# Patient Record
Sex: Male | Born: 2000 | Race: White | Hispanic: No | Marital: Single | Smoking: Never smoker
Health system: Southern US, Community
[De-identification: ages and names within clinical notes are randomized; demographics above are authoritative.]

---
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
## 2019-02-28 ENCOUNTER — Emergency Department: Payer: Self-pay

## 2019-02-28 ENCOUNTER — Emergency Department
Admission: EM | Admit: 2019-02-28 | Discharge: 2019-02-28 | Disposition: A | Discharge status: Home / Self Care | Payer: Self-pay | Attending: Emergency Medicine | Admitting: Emergency Medicine

## 2019-02-28 ENCOUNTER — Other Ambulatory Visit: Payer: Self-pay

## 2019-02-28 ENCOUNTER — Encounter: Payer: Self-pay | Admitting: Emergency Medicine

## 2019-02-28 DIAGNOSIS — Y9367 Activity, basketball: Secondary | ICD-10-CM | POA: Insufficient documentation

## 2019-02-28 DIAGNOSIS — Y999 Unspecified external cause status: Secondary | ICD-10-CM | POA: Insufficient documentation

## 2019-02-28 DIAGNOSIS — Y92310 Basketball court as the place of occurrence of the external cause: Secondary | ICD-10-CM | POA: Insufficient documentation

## 2019-02-28 DIAGNOSIS — S93431A Sprain of tibiofibular ligament of right ankle, initial encounter: Secondary | ICD-10-CM | POA: Insufficient documentation

## 2019-02-28 DIAGNOSIS — X503XXA Overexertion from repetitive movements, initial encounter: Secondary | ICD-10-CM | POA: Insufficient documentation

## 2019-02-28 DIAGNOSIS — S93491A Sprain of other ligament of right ankle, initial encounter: Secondary | ICD-10-CM

## 2019-02-28 MED ORDER — MELOXICAM 15 MG PO TABS: 15.0000 mg | ORAL_TABLET | Freq: Every day | ORAL | 1 refills | Status: AC

## 2019-02-28 NOTE — ED Provider Notes (Signed)
[facility] Emergency Department Provider Note  ____________________________________________  Time seen: Approximately 8:34 PM  I have reviewed the triage vital signs and the nursing notes.   HISTORY  Chief Complaint Ankle Pain    HPI Kyle Navarro is a 18 y.o. male presents to the emergency department with 8/10 right lateral ankle pain.  Patient states that he was going for a lay up and came down hard and felt a pop.  Patient has swelling over right lateral ankle.  No recent ankle sprains in the past. No alleviating measures have been attempted.         History reviewed. No pertinent past medical history.  There are no active problems to display for this patient.   History reviewed. No pertinent surgical history.  Prior to Admission medications   Medication Sig Start Date End Date Taking? Authorizing Provider  hydrocortisone 1 % lotion Apply 1 application topically 2 (two) times daily. 01/27/15   Beers, Charmayne Sheerharles M, PA-C  hydrOXYzine (VISTARIL) 25 MG capsule Take 1 capsule (25 mg total) by mouth 3 (three) times daily as needed. For itching 01/27/15   Beers, Charmayne Sheerharles M, PA-C  meloxicam (MOBIC) 15 MG tablet Take 1 tablet (15 mg total) by mouth daily for 7 days. 02/28/19 03/07/19  Orvil FeilWoods, Khari Lett M, PA-C  predniSONE (DELTASONE) 10 MG tablet Take 5 tablets (50 mg total) by mouth daily with breakfast. 01/27/15   Beers, Charmayne Sheerharles M, PA-C  ranitidine (ZANTAC) 150 MG tablet Take 1 tablet (150 mg total) by mouth 2 (two) times daily. 01/27/15   Beers, Charmayne Sheerharles M, PA-C    Allergies Penicillins  No family history on file.  Social History Social History   Tobacco Use  . Smoking status: Never Smoker  . Smokeless tobacco: Never Used  Substance Use Topics  . Alcohol use: Not on file  . Drug use: Not on file     Review of Systems  Constitutional: No fever/chills Eyes: No visual changes. No discharge ENT: No upper respiratory complaints. Cardiovascular: no chest  pain. Respiratory: no cough. No SOB. Gastrointestinal: No abdominal pain.  No nausea, no vomiting.  No diarrhea.  No constipation. Musculoskeletal: Patient has right ankle pain.  Skin: Negative for rash, abrasions, lacerations, ecchymosis. Neurological: Negative for headaches, focal weakness or numbness.  ____________________________________________   PHYSICAL EXAM:  VITAL SIGNS: ED Triage Vitals  Enc Vitals Group     BP 02/28/19 1725 129/65     Pulse Rate 02/28/19 1725 (!) 102     Resp 02/28/19 1725 16     Temp 02/28/19 1725 98.6 F (37 C)     Temp Source 02/28/19 1725 Oral     SpO2 02/28/19 1725 96 %     Weight 02/28/19 1726 180 lb (81.6 kg)     Height 02/28/19 1726 6\' 3"  (1.905 m)     Head Circumference --      Peak Flow --      Pain Score 02/28/19 1725 7     Pain Loc --      Pain Edu? --      Excl. in GC? --      Constitutional: Alert and oriented. Well appearing and in no acute distress. Eyes: Conjunctivae are normal. PERRL. EOMI. Head: Atraumatic. Cardiovascular: Normal rate, regular rhythm. Normal S1 and S2.  Good peripheral circulation. Respiratory: Normal respiratory effort without tachypnea or retractions. Lungs CTAB. Good air entry to the bases with no decreased or absent breath sounds. Gastrointestinal: Bowel sounds 4 quadrants. Soft  and nontender to palpation. No guarding or rigidity. No palpable masses. No distention. No CVA tenderness. Musculoskeletal: Patient is unable to perform full range of motion at the right ankle, likely secondary to pain.  Patient has tenderness to palpation over the anterior and posterior talofibular ligament.  He is able to move all 5 right toes.  Capillary refill less than 2 seconds, right. Neurologic:  Normal speech and language. No gross focal neurologic deficits are appreciated.  Skin:  Skin is warm, dry and intact. No rash noted. Psychiatric: Mood and affect are normal. Speech and behavior are normal. Patient exhibits  appropriate insight and judgement.   ____________________________________________   LABS (all labs ordered are listed, but only abnormal results are displayed)  Labs Reviewed - No data to display ____________________________________________  EKG   ____________________________________________  RADIOLOGY I personally viewed and evaluated these images as part of my medical decision making, as well as reviewing the written report by the radiologist.  Dg Ankle Complete Right  Result Date: 02/28/2019 CLINICAL DATA:  Pain and swelling. Pain began after playing basketball last night. EXAM: RIGHT ANKLE - COMPLETE 3+ VIEW COMPARISON:  None. FINDINGS: There is a calcification in the soft tissues immediately adjacent to the distal fibula which appears to be well corticated and is likely nonacute. There is lateral soft tissue swelling. Ankle mortise is intact. No other fractures. Lateral soft tissue swelling is noted. IMPRESSION: The well corticated calcification adjacent to the distal fibula is likely due to remote trauma. There is mild lateral soft tissue swelling. No acute fracture seen. Electronically Signed   By: Dorise Bullion III M.D   On: 02/28/2019 18:37   Dg Foot Complete Right  Result Date: 02/28/2019 CLINICAL DATA:  Right foot pain EXAM: RIGHT FOOT COMPLETE - 3+ VIEW COMPARISON:  None. FINDINGS: There is no evidence of fracture or dislocation. There is no evidence of arthropathy or other focal bone abnormality. Soft tissues are unremarkable. IMPRESSION: No acute displaced fracture or dislocation. If symptoms persist, follow-up radiographs are recommended. Electronically Signed   By: Constance Holster M.D.   On: 02/28/2019 20:13    ____________________________________________    PROCEDURES  Procedure(s) performed:    Procedures    Medications - No data to display   ____________________________________________   INITIAL IMPRESSION / ASSESSMENT AND PLAN / ED  COURSE  Pertinent labs & imaging results that were available during my care of the patient were reviewed by me and considered in my medical decision making (see chart for details).  Review of the [place] CSRS was performed in accordance of the [place] prior to dispensing any controlled drugs.         Assessment and plan Right ankle sprain 18 year old male presents to the emergency department with right lateral ankle pain after a fall.  X-ray examination reveals no acute bony abnormality.  An Ace wrap was applied in the emergency department and crutches were provided.  Patient was discharged with meloxicam.  He was advised to follow-up with podiatry.  All patient questions were answered.    ____________________________________________  FINAL CLINICAL IMPRESSION(S) / ED DIAGNOSES  Final diagnoses:  Sprain of anterior talofibular ligament of right ankle, initial encounter      NEW MEDICATIONS STARTED DURING THIS VISIT:  ED Discharge Orders         Ordered    meloxicam (MOBIC) 15 MG tablet  Daily     02/28/19 2022              This chart  was dictated using voice recognition software/Dragon. Despite best efforts to proofread, errors can occur which can change the meaning. Any change was purely unintentional.    Gabriell Daigneault M, PA-C 0Orvil Feil8/08/20 2040    Concha SeFunke, Mary E, MD 03/01/19 [phone]

## 2019-02-28 NOTE — ED Triage Notes (Signed)
Pt arrived via POV with reports of right ankle pain, after playing basketball last night. Pt states he was going up for a layup and when coming down heard a pop.  Pt reports pain and swelling, painful with weight bear activity.  Using crutches on arrival.

## 2020-09-08 IMAGING — CR RIGHT ANKLE - COMPLETE 3+ VIEW
3 series · 3 of 3 positions shown · non-contrast
Comparison: None.

CLINICAL DATA: Pain and swelling. Pain began after playing
basketball last night.

EXAM:
RIGHT ANKLE - COMPLETE 3+ VIEW

[ankle ap]
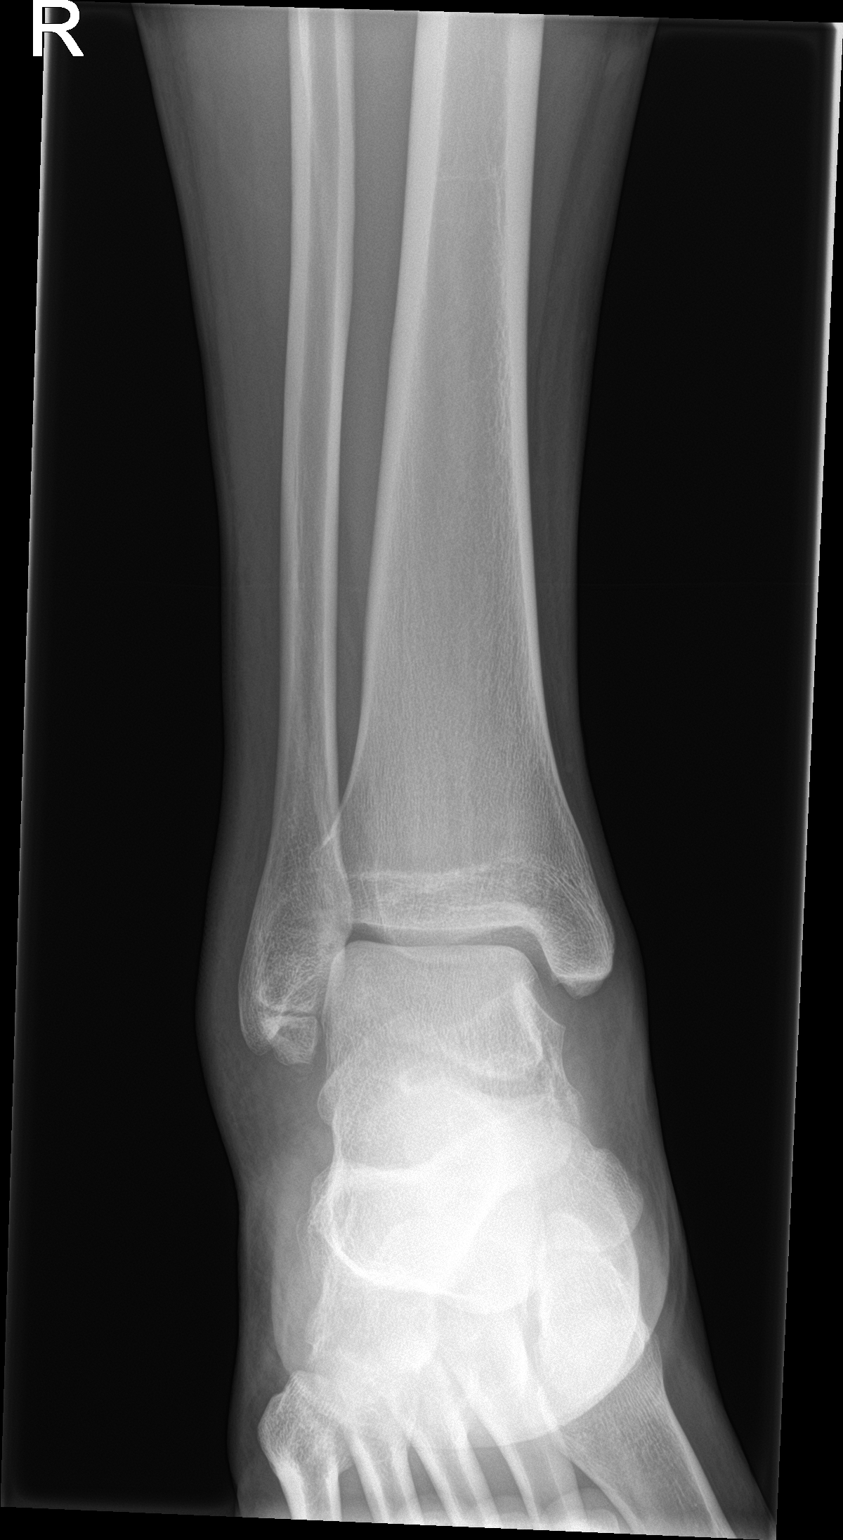

[ankle obl]
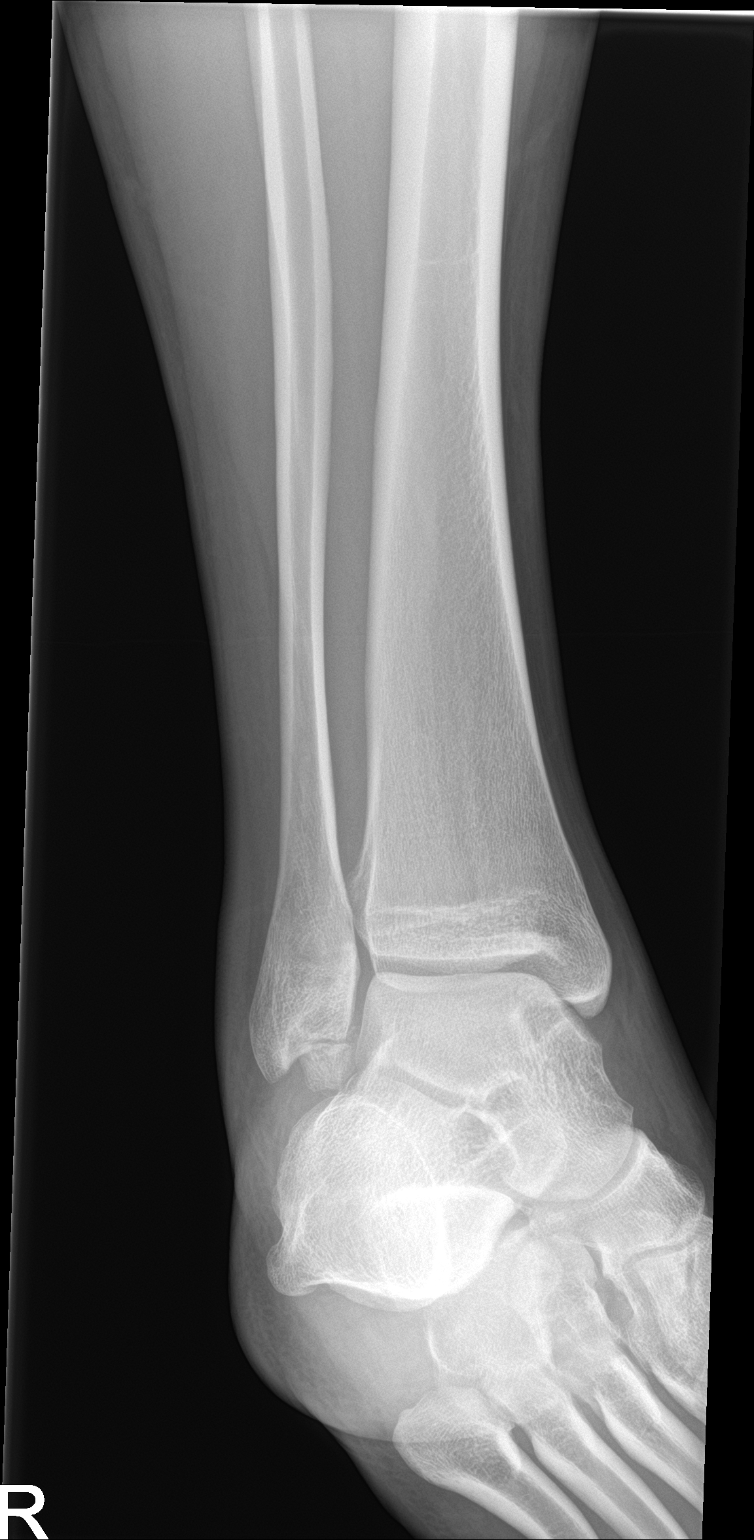

[ankle lat]
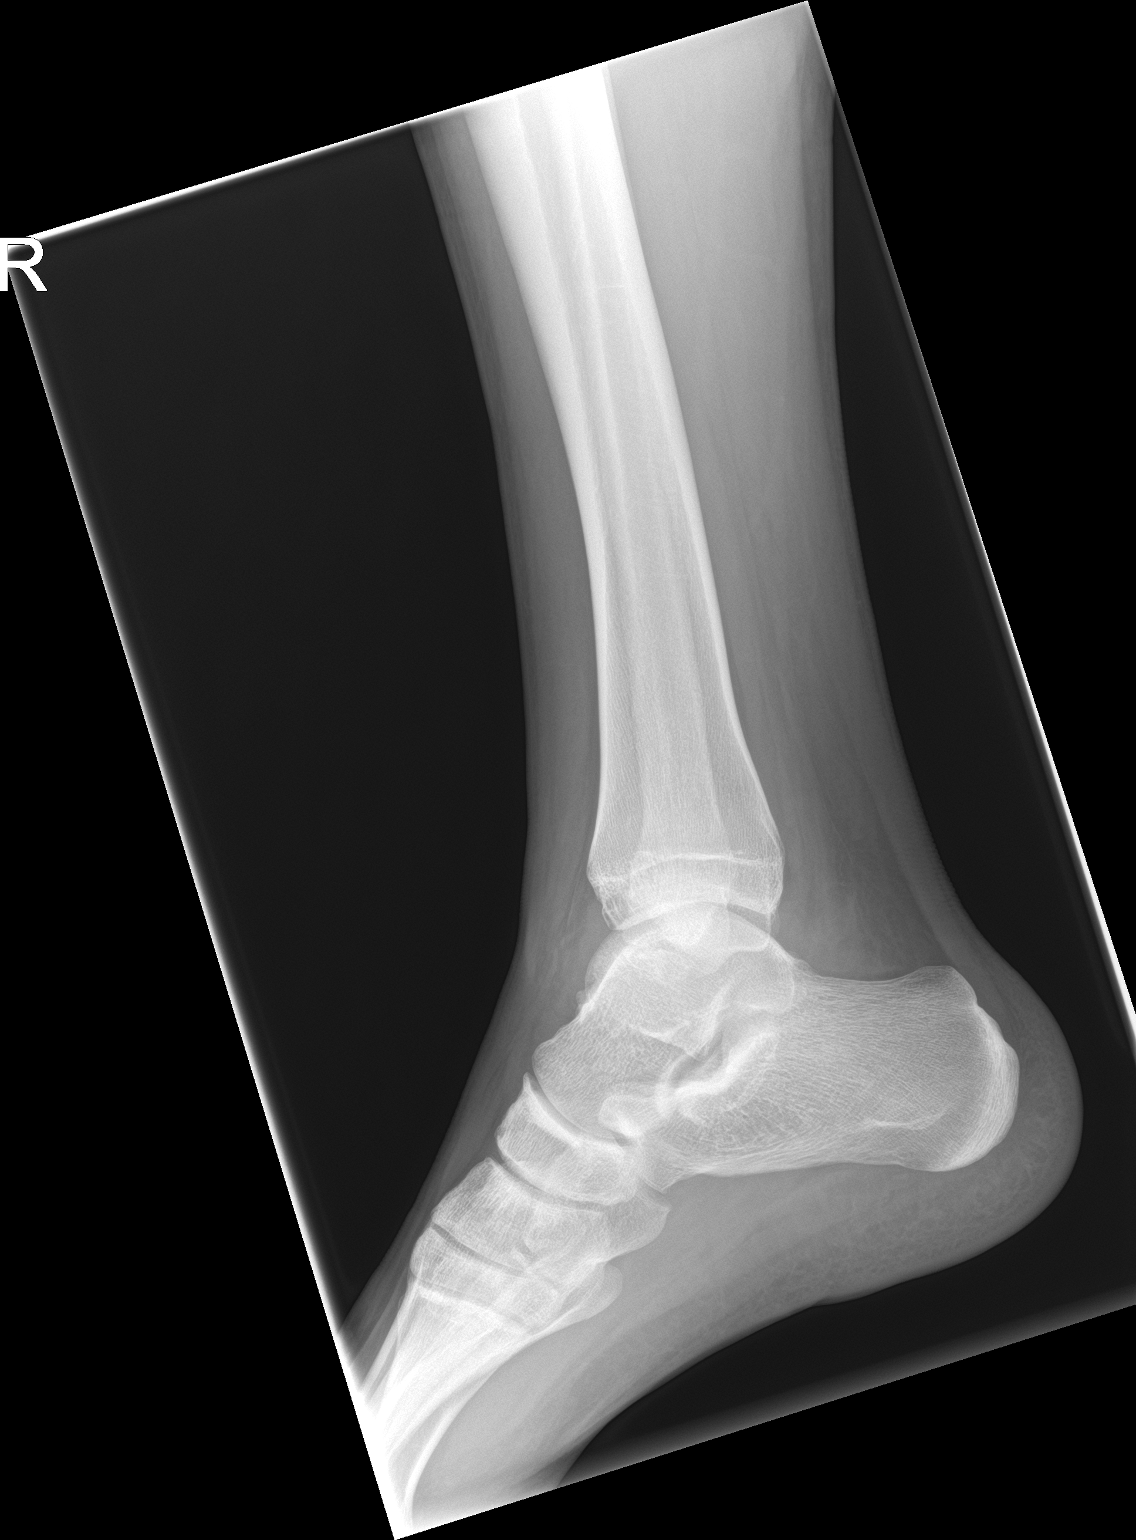

[3 of 3 positions shown; findings below may reference images not displayed]

FINDINGS: There is a calcification in the soft tissues immediately adjacent to
the distal fibula which appears to be well corticated and is likely
nonacute. There is lateral soft tissue swelling. Ankle mortise is
intact. No other fractures. Lateral soft tissue swelling is noted.
IMPRESSION: The well corticated calcification adjacent to the distal fibula is
likely due to remote trauma. There is mild lateral soft tissue
swelling. No acute fracture seen.

## 2023-03-20 ENCOUNTER — Ambulatory Visit
Admission: EM | Admit: 2023-03-20 | Discharge: 2023-03-20 | Disposition: A | Discharge status: Home / Self Care | Payer: Self-pay | Attending: Family Medicine | Admitting: Family Medicine

## 2023-03-20 DIAGNOSIS — R6883 Chills (without fever): Secondary | ICD-10-CM | POA: Insufficient documentation

## 2023-03-20 DIAGNOSIS — R55 Syncope and collapse: Secondary | ICD-10-CM | POA: Insufficient documentation

## 2023-03-20 DIAGNOSIS — G252 Other specified forms of tremor: Secondary | ICD-10-CM | POA: Insufficient documentation

## 2023-03-20 DIAGNOSIS — E8809 Other disorders of plasma-protein metabolism, not elsewhere classified: Secondary | ICD-10-CM | POA: Insufficient documentation

## 2023-03-20 DIAGNOSIS — Z758 Other problems related to medical facilities and other health care: Secondary | ICD-10-CM | POA: Insufficient documentation

## 2023-03-20 LAB — COMPREHENSIVE METABOLIC PANEL
ALT: 23 U/L (ref 0–44)
AST: 26 U/L (ref 15–41)
Albumin: 5.1 g/dL — ABNORMAL HIGH (ref 3.5–5.0)
Alkaline Phosphatase: 40 U/L (ref 38–126)
Anion gap: 12 (ref 5–15)
BUN: 14 mg/dL (ref 6–20)
CO2: 28 mmol/L (ref 22–32)
Calcium: 9.6 mg/dL (ref 8.9–10.3)
Chloride: 96 mmol/L — ABNORMAL LOW (ref 98–111)
Creatinine, Ser: 1 mg/dL (ref 0.61–1.24)
GFR, Estimated: >60 mL/min (ref >60)
Glucose, Bld: 89 mg/dL (ref 70–99)
Potassium: 3.8 mmol/L (ref 3.5–5.1)
Sodium: 136 mmol/L (ref 135–145)
Total Bilirubin: 0.5 mg/dL (ref 0.3–1.2)
Total Protein: 9.5 g/dL — ABNORMAL HIGH (ref 6.5–8.1)

## 2023-03-20 LAB — CBC WITH DIFFERENTIAL/PLATELET
Abs Immature Granulocytes: 0.02 10*3/uL (ref 0.00–0.07)
Basophils Absolute: 0.1 10*3/uL (ref 0.0–0.1)
Basophils Relative: 1 %
Eosinophils Absolute: 0.1 10*3/uL (ref 0.0–0.5)
Eosinophils Relative: 1 %
HCT: 47.8 % (ref 39.0–52.0)
Hemoglobin: 16.8 g/dL (ref 13.0–17.0)
Immature Granulocytes: 0 %
Lymphocytes Relative: 18 %
Lymphs Abs: 1.5 10*3/uL (ref 0.7–4.0)
MCH: 30.5 pg (ref 26.0–34.0)
MCHC: 35.1 g/dL (ref 30.0–36.0)
MCV: 86.9 fL (ref 80.0–100.0)
Monocytes Absolute: 0.9 10*3/uL (ref 0.1–1.0)
Monocytes Relative: 11 %
Neutro Abs: 5.6 10*3/uL (ref 1.7–7.7)
Neutrophils Relative %: 69 %
Platelets: 262 10*3/uL (ref 150–400)
RBC: 5.5 MIL/uL (ref 4.22–5.81)
RDW: 12.5 % (ref 11.5–15.5)
WBC: 8.1 10*3/uL (ref 4.0–10.5)
nRBC: 0 % (ref 0.0–0.2)

## 2023-03-20 LAB — TSH: TSH: 3.26 u[IU]/mL (ref 0.350–4.500)

## 2023-03-20 NOTE — ED Triage Notes (Signed)
Pt c/o chills,sweats & insomnia x2 days. Has tried nyquil w/o relief. States he will wake up with sweats.

## 2023-03-20 NOTE — ED Provider Notes (Signed)
[facility]    CSN: [id number] Arrival date & time: 03/20/23  1643      History   Chief Complaint Chief Complaint  Patient presents with   Chills   Sweats   Insomnia    HPI Kyle Navarro is a 22 y.o. male.   HPI  History obtained from the patient. Kyle Navarro presents for chills, night sweats and difficulty sleeping that started Sunday night. He woke up soaked.  He got in the shower and changed his covers and it happened again. Tuesday he went to woke and had a headache and started having chills at work. He left work and took [employer]. When he urinated he had "a lot of urine come out". Headache rated 6/10.  No unintended weight loss he has gained about 10 pounds. No history diabetes but grandpa had diabetes in his 50s-30s.  Kyle Navarro doesn't have a PCP.    Fever : no  Chills: yes Sore throat: no   Cough: not new, dust at work  Sputum: no Chest tightness: no Shortness of breath: no Wheezing: no  Nasal congestion : no  Rhinorrhea: no Myalgias: no Appetite: normal  Hydration: normal  Abdominal pain: no Nausea: no Vomiting: no Diarrhea: No Rash: No Sleep disturbance: yes Headache: yes     History reviewed. No pertinent past medical history.  There are no problems to display for this patient.   History reviewed. No pertinent surgical history.     Home Medications    Prior to Admission medications   Medication Sig Start Date End Date Taking? Authorizing Provider  hydrocortisone 1 % lotion Apply 1 application topically 2 (two) times daily. 01/27/15   Beers, Charmayne Sheer, PA-C  hydrOXYzine (VISTARIL) 25 MG capsule Take 1 capsule (25 mg total) by mouth 3 (three) times daily as needed. For itching 01/27/15   Beers, Charmayne Sheer, PA-C  predniSONE (DELTASONE) 10 MG tablet Take 5 tablets (50 mg total) by mouth daily with breakfast. 01/27/15   Beers, Charmayne Sheer, PA-C  ranitidine (ZANTAC) 150 MG tablet Take 1 tablet (150 mg total) by mouth 2 (two) times daily.  01/27/15   Beers, Charmayne Sheer, PA-C    Family History History reviewed. No pertinent family history.  Social History Social History   Tobacco Use   Smoking status: Never   Smokeless tobacco: Never  Vaping Use   Vaping status: Every Day     Allergies   Penicillins   Review of Systems Review of Systems: negative unless otherwise stated in HPI.      Physical Exam Triage Vital Signs ED Triage Vitals  Encounter Vitals Group     BP 03/20/23 1708 (!) 151/99     Systolic BP Percentile --      Diastolic BP Percentile --      Pulse Rate 03/20/23 1708 96     Resp 03/20/23 1708 16     Temp 03/20/23 1708 98.5 F (36.9 C)     Temp Source 03/20/23 1708 Oral     SpO2 03/20/23 1708 98 %     Weight 03/20/23 1707 185 lb (83.9 kg)     Height 03/20/23 1707 6\' 4"  (1.93 m)     Head Circumference --      Peak Flow --      Pain Score 03/20/23 1712 0     Pain Loc --      Pain Education --      Exclude from Growth Chart --    No data found.  Updated Vital Signs BP (!) 151/99 (BP Location: Left Arm)   Pulse 96   Temp 98.5 F (36.9 C) (Oral)   Resp 16   Ht 6\' 4"  (1.93 m)   Wt 83.9 kg   SpO2 98%   BMI 22.52 kg/m   Visual Acuity Right Eye Distance:   Left Eye Distance:   Bilateral Distance:    Right Eye Near:   Left Eye Near:    Bilateral Near:     Physical Exam GEN:     alert, well appearing male in no distress    HENT:  mucus membranes moist, oropharyngeal without lesions or erythema, EYES:   pupils equal and reactive, no scleral injection or discharge NECK:  normal ROM,no meningismus, no goiter, supple   RESP:  no increased work of breathing, clear to auscultation bilaterally CVS:   regular rate and rhythm NEURO:  alert, oriented, speech normal, CN 2-12 grossly intact, no facial droop,  sensation grossly intact, strength 5/5 bilateral UE and LE, normal coordination, Skin:   warm and dry, no rash on visible skin, well perfused     UC Treatments / Results  Labs (all  labs ordered are listed, but only abnormal results are displayed) Labs Reviewed  COMPREHENSIVE METABOLIC PANEL - Abnormal; Notable for the following components:      Result Value   Chloride 96 (*)    Total Protein 9.5 (*)    Albumin 5.1 (*)    All other components within normal limits  CBC WITH DIFFERENTIAL/PLATELET  TSH    EKG   Radiology No results found.  Procedures Procedures (including critical care time)  Medications Ordered in UC Medications - No data to display  Initial Impression / Assessment and Plan / UC Course  I have reviewed the triage vital signs and the nursing notes.  Pertinent labs & imaging results that were available during my care of the patient were reviewed by me and considered in my medical decision making (see chart for details).       Pt is a 22 y.o. male who presents for tremors, night sweats and chills. Kyle Navarro is afebrile here without recent antipyretics. Satting well on room air. Overall pt is non-toxic appearing, well hydrated, without respiratory distress.  Neurological and cardiopulmonary exams are unremarkable.  Discussed ordering labs including CBC, CMP and TSH patient is agreeable.  I was called out of the room as patient syncopized after giving blood.  Assessed patient and he was pale his lips were blue as well as his feet and fingertips.  He was trembling all over.  Patient would like to avoid the emergency department at this time.  Myself, RN Kyle Navarro and CMA Kyle Navarro cared for patient in the exam room for approximately 15 to 20 minutes.  EKG obtained and showed normal sinus rhythm without acute ST or T wave changes.  Patient able to speak with [place] and states that his tremors have not been this bad.  He is not having an acute MI at this time.  This does not seem to be anxiety related.  Given the night sweats considered malignancy versus pheochromocytoma.  His initial blood pressure is mildly elevated here at 151/99 though it did drop after he  syncopized.  It was much lower after his syncopal episode however this was not recorded.  I suspect this was a vasovagal syncope considering the relationship to blood draw.  CBC without cytosis, leukopenia, anemia or immature blood cells to suggest infection, inflammation or blood related  cancer.  He does have a elevated total protein to level at 9.5.  Recommended his new primary care provider recheck this after his initial visit.  He states he eats a lot of meat.  There are no significant electrolyte abnormalities.  TSH is normal.  Doubt thyroid pathology at this time.  Patient monitored in the urgent care with return to his baseline prior to discharge.  He was set up with a new primary care provider.  Patient will stay with his sister tonight was on FaceTime.  Strict ED precautions given.   Return and ED precautions given and voiced understanding. Discussed MDM, treatment plan and plan for follow-up with patient who agrees with plan.       Final Clinical Impressions(s) / UC Diagnoses   Final diagnoses:  Hyperproteinemia  Chills (without fever)  Coarse tremors  Syncope, unspecified syncope type  Does not have primary care provider     Discharge Instructions      Your protein level is elevated otherwise your blood work is normal.  Thyroid hormone test will return either later tonight or tomorrow.  If abnormal someone will contact you.  It is important that you establish care with a primary care doctor.  You have any passing out episodes, chest pain or shortness of breath, call EMS/ambulance.     ED Prescriptions   None    PDMP not reviewed this encounter.   Katha Cabal, DO 03/23/23 1015

## 2023-03-20 NOTE — Discharge Instructions (Addendum)
Your protein level is elevated otherwise your blood work is normal.  Thyroid hormone test will return either later tonight or tomorrow.  If abnormal someone will contact you.  It is important that you establish care with a primary care doctor.  You have any passing out episodes, chest pain or shortness of breath, call EMS/ambulance.

## 2023-05-17 ENCOUNTER — Ambulatory Visit: Payer: Self-pay | Admitting: Nurse Practitioner

## 2023-05-17 ENCOUNTER — Other Ambulatory Visit: Payer: Self-pay

## 2023-05-17 VITALS — BP 126/72 | HR 100 | Temp 98.0°F | Resp 18 | Ht 75.0 in | Wt 179.5 lb

## 2023-05-17 DIAGNOSIS — Z1159 Encounter for screening for other viral diseases: Secondary | ICD-10-CM

## 2023-05-17 DIAGNOSIS — Z130 Encounter for screening for diseases of the blood and blood-forming organs and certain disorders involving the immune mechanism: Secondary | ICD-10-CM

## 2023-05-17 DIAGNOSIS — Z7689 Persons encountering health services in other specified circumstances: Secondary | ICD-10-CM

## 2023-05-17 DIAGNOSIS — Z131 Encounter for screening for diabetes mellitus: Secondary | ICD-10-CM

## 2023-05-17 DIAGNOSIS — R55 Syncope and collapse: Secondary | ICD-10-CM

## 2023-05-17 DIAGNOSIS — G252 Other specified forms of tremor: Secondary | ICD-10-CM

## 2023-05-17 DIAGNOSIS — R61 Generalized hyperhidrosis: Secondary | ICD-10-CM

## 2023-05-17 DIAGNOSIS — Z114 Encounter for screening for human immunodeficiency virus [HIV]: Secondary | ICD-10-CM

## 2023-05-17 NOTE — Progress Notes (Addendum)
BP 126/72   Pulse 100   Temp 98 F (36.7 C) (Oral)   Resp 18   Ht 6\' 3"  (1.905 m)   Wt 179 lb 8 oz (81.4 kg)   SpO2 99%   BMI 22.44 kg/m    Subjective:    Patient ID: Kyle Navarro, male    DOB: 2000/10/02, 22 y.o.   MRN: [id number]  HPI: Kyle Navarro is a 22 y.o. male  Chief Complaint  Patient presents with   Establish Care   Establish care: his last physical was years ago.  Medical history includes none.  Family history includes HTN, DM.  Health Maintenance labs.   Urgent care follow up/tremors/syncope: seen at urgent care on 03/20/2023 Notes from urgent care: History obtained from the patient. Filbert presents for chills, night sweats and difficulty sleeping that started Sunday night. He woke up soaked.  He got in the shower and changed his covers and it happened again. Tuesday he went to woke and had a headache and started having chills at work. He left work and took [employer]. When he urinated he had "a lot of urine come out". Headache rated 6/10.  No unintended weight loss he has gained about 10 pounds. No history diabetes but grandpa had diabetes in his 26s-30s.  Anselmo doesn't have a PCP.   Pt is a 22 y.o. male who presents for tremors, night sweats and chills. Joni Reining is afebrile here without recent antipyretics. Satting well on room air. Overall pt is non-toxic appearing, well hydrated, without respiratory distress.  Neurological and cardiopulmonary exams are unremarkable.  Discussed ordering labs including CBC, CMP and TSH patient is agreeable.   I was called out of the room as patient syncopized after giving blood.  Assessed patient and he was pale his lips were blue as well as his feet and fingertips.  He was trembling all over.  Patient would like to avoid the emergency department at this time.  Myself, RN Tiara and CMA Juan cared for patient in the exam room for approximately 15 to 20 minutes.  EKG obtained and showed normal sinus rhythm without acute ST  or T wave changes.  Patient able to speak with [place] and states that his tremors have not been this bad.  He is not having an acute MI at this time.  This does not seem to be anxiety related.  Given the night sweats considered malignancy versus pheochromocytoma.  His initial blood pressure is mildly elevated here at 151/99 though it did drop after he syncopized.  It was much lower after his syncopal episode however this was not recorded.  I suspect this was a vasovagal syncope considering the relationship to blood draw.   CBC without cytosis, leukopenia, anemia or immature blood cells to suggest infection, inflammation or blood related cancer.  He does have a elevated total protein to level at 9.5.  Recommended his new primary care provider recheck this after his initial visit.  He states he eats a lot of meat.  There are no significant electrolyte abnormalities.  TSH is normal.  Doubt thyroid pathology at this time.   Patient monitored in the urgent care with return to his baseline prior to discharge.  He was set up with a new primary care provider.  Patient will stay with his sister tonight was on FaceTime.  Strict ED precautions given.  Patient reports that the above incident has not happened again. 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  He reports he is no longer having night sweats, tremors or syncopal episodes. Will repeat CMP due to elevated protein at urgent care.    Relevant past medical, surgical, family and social history reviewed and updated as indicated. Interim medical history since our last visit reviewed. Allergies and medications reviewed and updated.  Review of Systems  Constitutional: Negative for fever or weight change.  Respiratory: Negative for cough and shortness of breath.   Cardiovascular: Negative for chest pain or palpitations.  Gastrointestinal: Negative for abdominal pain, no bowel changes.  Musculoskeletal: Negative for gait problem or joint swelling.   Skin: Negative for rash.  Neurological: Negative for dizziness or headache.  No other specific complaints in a complete review of systems (except as listed in HPI above).      Objective:    BP 126/72   Pulse 100   Temp 98 F (36.7 C) (Oral)   Resp 18   Ht 6\' 3"  (1.905 m)   Wt 179 lb 8 oz (81.4 kg)   SpO2 99%   BMI 22.44 kg/m   Wt Readings from Last 3 Encounters:  05/17/23 179 lb 8 oz (81.4 kg)  03/20/23 185 lb (83.9 kg)  02/28/19 180 lb (81.6 kg) (81%, Z= 0.88)*   * Growth percentiles are based on CDC (Boys, 2-20 Years) data.    Physical Exam  Constitutional: Patient appears well-developed and well-nourished.  No distress.  HEENT: head atraumatic, normocephalic, pupils equal and reactive to light, neck supple Cardiovascular: Normal rate, regular rhythm and normal heart sounds.  No murmur heard. No BLE edema. Pulmonary/Chest: Effort normal and breath sounds normal. No respiratory distress. Abdominal: Soft.  There is no tenderness. Psychiatric: Patient has a normal mood and affect. behavior is normal. Judgment and thought content normal.   Results for orders placed or performed during the hospital encounter of 03/20/23  CBC with Differential  Result Value Ref Range   WBC 8.1 4.0 - 10.5 K/uL   RBC 5.50 4.22 - 5.81 MIL/uL   Hemoglobin 16.8 13.0 - 17.0 g/dL   HCT 40.9 81.1 - 91.4 %   MCV 86.9 80.0 - 100.0 fL   MCH 30.5 26.0 - 34.0 pg   MCHC 35.1 30.0 - 36.0 g/dL   RDW 78.2 95.6 - 21.3 %   Platelets 262 150 - 400 K/uL   nRBC 0.0 0.0 - 0.2 %   Neutrophils Relative % 69 %   Neutro Abs 5.6 1.7 - 7.7 K/uL   Lymphocytes Relative 18 %   Lymphs Abs 1.5 0.7 - 4.0 K/uL   Monocytes Relative 11 %   Monocytes Absolute 0.9 0.1 - 1.0 K/uL   Eosinophils Relative 1 %   Eosinophils Absolute 0.1 0.0 - 0.5 K/uL   Basophils Relative 1 %   Basophils Absolute 0.1 0.0 - 0.1 K/uL   Immature Granulocytes 0 %   Abs Immature Granulocytes 0.02 0.00 - 0.07 K/uL  Comprehensive metabolic panel   Result Value Ref Range   Sodium 136 135 - 145 mmol/L   Potassium 3.8 3.5 - 5.1 mmol/L   Chloride 96 (L) 98 - 111 mmol/L   CO2 28 22 - 32 mmol/L   Glucose, Bld 89 70 - 99 mg/dL   BUN 14 6 - 20 mg/dL   Creatinine, Ser 0.86 0.61 - 1.24 mg/dL   Calcium 9.6 8.9 - 57.8 mg/dL   Total Protein 9.5 (H) 6.5 - 8.1 g/dL   Albumin 5.1 (H) 3.5 - 5.0 g/dL   AST 26 15 -  41 U/L   ALT 23 0 - 44 U/L   Alkaline Phosphatase 40 38 - 126 U/L   Total Bilirubin 0.5 0.3 - 1.2 mg/dL   GFR, Estimated >78 >29 mL/min   Anion gap 12 5 - 15  TSH  Result Value Ref Range   TSH 3.260 0.350 - 4.500 uIU/mL      Assessment & Plan:   Problem List Items Addressed This Visit   None Visit Diagnoses     Coarse tremors    -  Primary   resoloved, will recheck labs   Relevant Orders   CBC with Differential/Platelet   COMPLETE METABOLIC PANEL WITH GFR   Night sweats       resoloved, will recheck labs   Relevant Orders   CBC with Differential/Platelet   COMPLETE METABOLIC PANEL WITH GFR   Syncope, unspecified syncope type       resoloved, will recheck labs   Relevant Orders   CBC with Differential/Platelet   COMPLETE METABOLIC PANEL WITH GFR   Screening for diabetes mellitus       Relevant Orders   COMPLETE METABOLIC PANEL WITH GFR   Hemoglobin A1c   Screening for deficiency anemia       Relevant Orders   CBC with Differential/Platelet   Encounter for hepatitis C screening test for low risk patient       Relevant Orders   Hepatitis C antibody   Screening for HIV without presence of risk factors       Relevant Orders   HIV Antibody (routine testing w rflx)   Encounter to establish care       schedule cpe       Patient was leaving after getting labs done and had a vasovagal reaction.  He denies any injury. Patient was given some water and observed for a period of time, recovered quickly and was discharged.   Follow up plan: Return in about 1 year (around 05/16/2024) for cpe.

## 2023-05-18 LAB — CBC WITH DIFFERENTIAL/PLATELET
Absolute Lymphocytes: 1134 {cells}/uL (ref 850–3900)
Absolute Monocytes: 400 {cells}/uL (ref 200–950)
Basophils Absolute: 31 {cells}/uL (ref 0–200)
Basophils Relative: 0.6 %
Eosinophils Absolute: 99 {cells}/uL (ref 15–500)
Eosinophils Relative: 1.9 %
HCT: 46.8 % (ref 38.5–50.0)
Hemoglobin: 15.4 g/dL (ref 13.2–17.1)
MCH: 30.5 pg (ref 27.0–33.0)
MCHC: 32.9 g/dL (ref 32.0–36.0)
MCV: 92.7 fL (ref 80.0–100.0)
MPV: 9.8 fL (ref 7.5–12.5)
Monocytes Relative: 7.7 %
Neutro Abs: 3536 {cells}/uL (ref 1500–7800)
Neutrophils Relative %: 68 %
Platelets: 236 10*3/uL (ref 140–400)
RBC: 5.05 10*6/uL (ref 4.20–5.80)
RDW: 13 % (ref 11.0–15.0)
Total Lymphocyte: 21.8 %
WBC: 5.2 10*3/uL (ref 3.8–10.8)

## 2023-05-18 LAB — COMPLETE METABOLIC PANEL WITH GFR
AG Ratio: 1.9 (calc) (ref 1.0–2.5)
ALT: 10 U/L (ref 9–46)
AST: 13 U/L (ref 10–40)
Albumin: 4.8 g/dL (ref 3.6–5.1)
Alkaline phosphatase (APISO): 35 U/L — ABNORMAL LOW (ref 36–130)
BUN: 15 mg/dL (ref 7–25)
CO2: 30 mmol/L (ref 20–32)
Calcium: 9.7 mg/dL (ref 8.6–10.3)
Chloride: 104 mmol/L (ref 98–110)
Creat: 0.9 mg/dL (ref 0.60–1.24)
Globulin: 2.5 g/dL (ref 1.9–3.7)
Glucose, Bld: 91 mg/dL (ref 65–99)
Potassium: 4.5 mmol/L (ref 3.5–5.3)
Sodium: 140 mmol/L (ref 135–146)
Total Bilirubin: 0.4 mg/dL (ref 0.2–1.2)
Total Protein: 7.3 g/dL (ref 6.1–8.1)
eGFR: 123 mL/min/{1.73_m2} (ref >=60)

## 2023-05-18 LAB — HEMOGLOBIN A1C
Hgb A1c MFr Bld: 5.1 %{Hb} (ref <5.7)
Mean Plasma Glucose: 100 mg/dL
eAG (mmol/L): 5.5 mmol/L

## 2023-05-18 LAB — HEPATITIS C ANTIBODY: Hepatitis C Ab: NONREACTIVE

## 2023-05-18 LAB — HIV ANTIBODY (ROUTINE TESTING W REFLEX): HIV 1&2 Ab, 4th Generation: NONREACTIVE

## 2024-05-20 ENCOUNTER — Encounter: Payer: Self-pay | Admitting: Nurse Practitioner

## 2024-05-20 DIAGNOSIS — Z23 Encounter for immunization: Secondary | ICD-10-CM

## 2024-05-20 NOTE — Progress Notes (Deleted)
 Name: Kyle Navarro   MRN: [id number]    DOB: [birth year]   Date:05/20/2024       Progress Note  Subjective  Chief Complaint  No chief complaint on file.   HPI  Patient presents for annual CPE .    Diet: *** Exercise: *** Sleep: *** Last dental exam:*** Last eye exam: ***  Depression: phq 9 is {gen pos [id number]}    05/17/2023    8:07 AM  Depression screen PHQ 2/9  Decreased Interest 0  Down, Depressed, Hopeless 0  PHQ - 2 Score 0    Hypertension:  BP Readings from Last 3 Encounters:  05/17/23 126/72  03/20/23 (!) 151/99  02/28/19 127/79    Obesity: Wt Readings from Last 3 Encounters:  05/17/23 81.4 kg  03/20/23 83.9 kg  02/28/19 81.6 kg (81%, Z= 0.88)*   * Growth percentiles are based on CDC (Boys, 2-20 Years) data.   BMI Readings from Last 3 Encounters:  05/17/23 22.44 kg/m  03/20/23 22.52 kg/m  02/28/19 22.50 kg/m (45%, Z= -0.12)*   * Growth percentiles are based on CDC (Boys, 2-20 Years) data.     Lipids:  No results found for: CHOL No results found for: HDL No results found for: LDLCALC No results found for: TRIG No results found for: CHOLHDL No results found for: LDLDIRECT Glucose:  Glucose, Bld  Date Value Ref Range Status  05/17/2023 91 65 - 99 mg/dL Final    Comment:    .            Fasting reference interval .   03/20/2023 89 70 - 99 mg/dL Final    Comment:    Glucose reference range applies only to samples taken after fasting for at least 8 hours.    Flowsheet Row Office Visit from 05/17/2023 in [facility]  AUDIT-C Score 0    Single STD testing and prevention (HIV/chl/gon/syphilis): Completed 05/17/2023 Hep C: Completed 05/17/2023  Skin cancer: Discussed monitoring for atypical lesions Colorectal cancer: Does not qualify. Prostate cancer: Does not qualify. No results found for: PSA   Lung cancer: Low Dose CT Chest recommended if Age 40-80 years, 30 pack-year currently  smoking OR have quit w/in 15years. Patient does not qualify.   AAA: The USPSTF recommends one-time screening with ultrasonography in men ages 72 to 75 years who have ever smoked. Does not qualify. ECG:  03/20/2023  Vaccines:  HPV: up to at age 79 , ask insurance if age between 41-45  Shingrix: 36-64 yo and ask insurance if covered when patient above 53 yo Pneumonia: Educated and discussed with patient. Flu: Educated and discussed with patient.  Advanced Care Planning: A voluntary discussion about advance care planning including the explanation and discussion of advance directives.  Discussed health care proxy and Living will, and the patient was able to identify a health care proxy as Joen Bud (mother).  Patient does not have a living will at present time. If patient does have living will, I have requested they bring this to the clinic to be scanned in to their chart.  There are no active problems to display for this patient.   No past surgical history on file.  Family History  Problem Relation Age of Onset   Hypertension Father    Diabetes Maternal Grandfather    Hypertension Paternal Grandfather     Social History   Socioeconomic History   Marital status: Single    Spouse name: Not on file   Number of  children: Not on file   Years of education: Not on file   Highest education level: 12th grade  Occupational History   Not on file  Tobacco Use   Smoking status: Never    Passive exposure: Never   Smokeless tobacco: Never  Vaping Use   Vaping status: Former  Substance and Sexual Activity   Alcohol use: Never   Drug use: Never   Sexual activity: Yes    Birth control/protection: Condom  Other Topics Concern   Not on file  Social History Narrative   Works for a scientist, 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   Social Drivers of Corporate Investment Banker Strain: Low Risk  (05/17/2023)   Overall Financial Resource Strain (CARDIA)    Difficulty of  Paying Living Expenses: Not hard at all  Food Insecurity: No Food Insecurity (05/17/2023)   Hunger Vital Sign    Worried About Running Out of Food in the Last Year: Never true    Ran Out of Food in the Last Year: Never true  Transportation Needs: No Transportation Needs (05/17/2023)   PRAPARE - Administrator, Civil Service (Medical): No    Lack of Transportation (Non-Medical): No  Physical Activity: Sufficiently Active (05/17/2023)   Exercise Vital Sign    Days of Exercise per Week: 5 days    Minutes of Exercise per Session: 80 min  Stress: No Stress Concern Present (05/17/2023)   [employer] of Occupational Health - Occupational Stress Questionnaire    Feeling of Stress : Only a little  Social Connections: Socially Isolated (05/17/2023)   Social Connection and Isolation Panel    Frequency of Communication with Friends and Family: Three times a week    Frequency of Social Gatherings with Friends and Family: Three times a week    Attends Religious Services: Never    Active Member of Clubs or Organizations: No    Attends Banker Meetings: Not on file    Marital Status: Never married  Intimate Partner Violence: Not At Risk (05/17/2023)   Humiliation, Afraid, Rape, and Kick questionnaire    Fear of Current or Ex-Partner: No    Emotionally Abused: No    Physically Abused: No    Sexually Abused: No    No current outpatient medications on file.  Allergies  Allergen Reactions   Penicillins Other (See Comments)    UNKNOWN REACTION     ROS  Constitutional: Negative for fever or weight change.  Respiratory: Negative for cough and shortness of breath.   Cardiovascular: Negative for chest pain or palpitations.  Gastrointestinal: Negative for abdominal pain, no bowel changes.  Musculoskeletal: Negative for gait problem or joint swelling.  Skin: Negative for rash.  Neurological: Negative for dizziness or headache.  No other specific complaints in a  complete review of systems (except as listed in HPI above).   Objective  There were no vitals filed for this visit.  There is no height or weight on file to calculate BMI.  Physical Exam ***  No results found for this or any previous visit (from the past 2160 hours).   Fall Risk:    05/17/2023    8:07 AM  Fall Risk   Falls in the past year? 0  Number falls in past yr: 0  Injury with Fall? 0  Risk for fall due to : No Fall Risks  Follow up Falls prevention discussed   ***   Functional Status Survey:   ***  Assessment & Plan  1. Immunization due (Primary) ***    -Prostate cancer screening and PSA options (with potential risks and benefits of testing vs not testing) were discussed along with recent recs/guidelines. -USPSTF grade A and B recommendations reviewed with patient; age-appropriate recommendations, preventive care, screening tests, etc discussed and encouraged; healthy living encouraged; see AVS for patient education given to patient -Discussed importance of 150 minutes of physical activity weekly, eat two servings of fish weekly, eat one serving of tree nuts ( cashews, pistachios, pecans, almonds.[email or web address]) every other day, eat 6 servings of fruit/vegetables daily and drink plenty of water and avoid sweet beverages.  -Reviewed Health Maintenance: Yes
